# Patient Record
Sex: Female | Born: 2000 | Race: Black or African American | Hispanic: No | Marital: Single | Smoking: Never smoker
Health system: Southern US, Community
[De-identification: ages and names within clinical notes are randomized; demographics above are authoritative.]

## PROBLEM LIST (undated history)

## (undated) DIAGNOSIS — J45909 Unspecified asthma, uncomplicated: Secondary | ICD-10-CM

## (undated) DIAGNOSIS — T7840XA Allergy, unspecified, initial encounter: Secondary | ICD-10-CM

---

## 2018-03-23 ENCOUNTER — Encounter (HOSPITAL_COMMUNITY): Payer: Self-pay | Admitting: Emergency Medicine

## 2018-03-23 ENCOUNTER — Ambulatory Visit (HOSPITAL_COMMUNITY)
Admission: EM | Admit: 2018-03-23 | Discharge: 2018-03-23 | Disposition: A | Discharge status: Home / Self Care | Payer: Medicaid Other | Attending: Family Medicine | Admitting: Family Medicine

## 2018-03-23 ENCOUNTER — Other Ambulatory Visit: Payer: Self-pay

## 2018-03-23 DIAGNOSIS — R0789 Other chest pain: Secondary | ICD-10-CM | POA: Diagnosis not present

## 2018-03-23 HISTORY — DX: Unspecified asthma, uncomplicated: J45.909

## 2018-03-23 MED ORDER — GI COCKTAIL ~~LOC~~
ORAL | Status: AC
  Filled 2018-03-23: qty 30

## 2018-03-23 MED ORDER — GI COCKTAIL ~~LOC~~
30.0000 mL | Freq: Once | ORAL | Status: AC
  Administered 2018-03-23: 30 mL via ORAL

## 2018-03-23 NOTE — Discharge Instructions (Signed)
You have been seen at the [facility] today for chest pain. Your evaluation today was not suggestive of any emergent condition requiring medical intervention at this time. However, some medical problems make take more time to appear. Therefore, it is important for you to watch for any new symptoms or worsening of your current condition.  Please return the Urgent Care or to the Emergency Department immediately should you feel worse in any way or have any of the following symptoms: increasing or different chest pain, pain that spreads to your arm, neck, jaw, back or abdomen, shortness of breath, or nausea and vomiting.
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
## 2018-04-10 ENCOUNTER — Other Ambulatory Visit: Payer: Self-pay

## 2018-04-10 ENCOUNTER — Encounter (HOSPITAL_COMMUNITY): Payer: Self-pay | Admitting: Emergency Medicine

## 2018-04-10 ENCOUNTER — Emergency Department (HOSPITAL_COMMUNITY)
Admission: EM | Admit: 2018-04-10 | Discharge: 2018-04-11 | Disposition: A | Discharge status: Home / Self Care | Payer: Medicaid Other | Attending: Emergency Medicine | Admitting: Emergency Medicine

## 2018-04-10 DIAGNOSIS — Z79899 Other long term (current) drug therapy: Secondary | ICD-10-CM | POA: Insufficient documentation

## 2018-04-10 DIAGNOSIS — T7621XA Adult sexual abuse, suspected, initial encounter: Secondary | ICD-10-CM | POA: Insufficient documentation

## 2018-04-10 DIAGNOSIS — J45909 Unspecified asthma, uncomplicated: Secondary | ICD-10-CM | POA: Insufficient documentation

## 2018-04-10 DIAGNOSIS — T7421XA Adult sexual abuse, confirmed, initial encounter: Secondary | ICD-10-CM

## 2018-04-10 HISTORY — DX: Allergy, unspecified, initial encounter: T78.40XA

## 2018-04-10 LAB — PREGNANCY, URINE: Preg Test, Ur: NEGATIVE

## 2018-04-10 NOTE — ED Notes (Signed)
SANE RN in room speaking with pt.
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
## 2018-04-11 NOTE — SANE Note (Signed)
Follow-up Phone Call  Patient gives verbal consent for a FNE/SANE follow-up phone call in 48-72 hours: No Patient's telephone number: not given Patient gives verbal consent to leave voicemail at the phone number listed above: No DO NOT CALL between the hours of: No consent for follow up contact

## 2018-04-11 NOTE — SANE Note (Signed)
The patient states "I will go to the [facility] free STI testing. I just saw the flyer. If I don't go there I can go to my regular doctor, [facility]."

## 2018-04-12 ENCOUNTER — Ambulatory Visit (HOSPITAL_COMMUNITY)
Admission: EM | Admit: 2018-04-12 | Discharge: 2018-04-12 | Disposition: A | Discharge status: Home / Self Care | Payer: No Typology Code available for payment source | Source: Ambulatory Visit | Attending: Emergency Medicine | Admitting: Emergency Medicine

## 2018-04-12 ENCOUNTER — Encounter (HOSPITAL_COMMUNITY): Payer: Self-pay | Admitting: Emergency Medicine

## 2018-04-12 ENCOUNTER — Emergency Department (HOSPITAL_COMMUNITY)
Admission: EM | Admit: 2018-04-12 | Discharge: 2018-04-12 | Disposition: A | Discharge status: Home / Self Care | Payer: Medicaid Other | Attending: Emergency Medicine | Admitting: Emergency Medicine

## 2018-04-12 DIAGNOSIS — T7621XA Adult sexual abuse, suspected, initial encounter: Secondary | ICD-10-CM | POA: Diagnosis present

## 2018-04-12 DIAGNOSIS — Z79899 Other long term (current) drug therapy: Secondary | ICD-10-CM | POA: Diagnosis not present

## 2018-04-12 DIAGNOSIS — T7421XA Adult sexual abuse, confirmed, initial encounter: Secondary | ICD-10-CM

## 2018-04-12 DIAGNOSIS — J45909 Unspecified asthma, uncomplicated: Secondary | ICD-10-CM | POA: Insufficient documentation

## 2018-04-12 DIAGNOSIS — Z0441 Encounter for examination and observation following alleged adult rape: Secondary | ICD-10-CM | POA: Diagnosis present
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
## 2018-04-14 NOTE — ED Provider Notes (Signed)
[facility]ARE [facility]ENTER   [id number] 03/23/18 Arrival Time: 1652  ASSESSMENT & PLAN:  1. Discomfort in chest    Possibly related to GERD. Some improvement after GI cocktail. Question anxiety component. Discussed.  Meds ordered this encounter  Medications  . gi cocktail (Maalox,Lidocaine,Donnatal)   May try OTC Prilosec for a few weeks. Patient history and exam consistent with non-cardiac cause of chest pain. Conservative measures indicated.  Chest pain precautions given. ED if abrupt worsening or new symptoms. Reviewed expectations re: course of current medical issues. Questions answered. Outlined signs and symptoms indicating need for more acute intervention. Patient verbalized understanding. After Visit Summary given.   SUBJECTIVE:  Tiffany Adams is a 17 y.o. female who presents with complaint of:  Chest Discomfort: Mid to lower. Occasionally feels in back. Last felt today when walking to class. No SOB/n/v reported. Occasional panic attacks and questions if this is related to anxiety. Occasionally "feel my heart racing". Currently without chest discomfort. Describes discomfort as intermittent and sharp in nature. Rates discomfort as a 2/10 in intensity when present. No specific aggravating or alleviating factors reported. No new medications. Also reports a "dry cough" first noticed today. Asthma history but without wheezing currently. Cardiac risk factors: none. Patient's risk factors for DVT/PE: none. "Saw a heart doctor several years ago for same symptoms. Everything was normal". Questions if she is having heartburn sometimes. Occasional heartburn symptoms after eating.  Social History   Tobacco Use  Smoking Status Never Smoker  Smokeless Tobacco Never Used   OTC treatment: none.  ROS: As per HPI. All other systems negative.    OBJECTIVE:  Vitals:   03/23/18 1713  BP: 120/72  Pulse: 73  Temp: 98.3 F (36.8 C)  TempSrc: Oral  SpO2: 100%    General  appearance: alert; no distress Eyes: PERRLA; EOMI; conjunctiva normal HENT: normocephalic; atraumatic Neck: supple Lungs: clear to auscultation bilaterally Heart: regular rate and rhythm Chest Wall: none-tender Abdomen: soft, non-tender; bowel sounds normal; no masses or organomegaly; no guarding or rebound tenderness Extremities: no cyanosis or edema; symmetrical with no gross deformities Skin: warm and dry Psychological: alert and cooperative; normal mood and affect   No Known Allergies  Past Medical History:  Diagnosis Date  . Allergies   . Asthma    Social History   Socioeconomic History  . Marital status: Single    Spouse name: Not on file  . Number of children: Not on file  . Years of education: Not on file  . Highest education level: Not on file  Occupational History  . Not on file  Social Needs  . Financial resource strain: Not on file  . Food insecurity:    Worry: Not on file    Inability: Not on file  . Transportation needs:    Medical: Not on file    Non-medical: Not on file  Tobacco Use  . Smoking status: Never Smoker  . Smokeless tobacco: Never Used  Substance and Sexual Activity  . Alcohol use: Never    Frequency: Never  . Drug use: Never  . Sexual activity: Not on file  Lifestyle  . Physical activity:    Days per week: Not on file    Minutes per session: Not on file  . Stress: Not on file  Relationships  . Social connections:    Talks on phone: Not on file    Gets together: Not on file    Attends religious service: Not on file    Active member of  club or organization: Not on file    Attends meetings of clubs or organizations: Not on file    Relationship status: Not on file  . Intimate partner violence:    Fear of current or ex partner: Not on file    Emotionally abused: Not on file    Physically abused: Not on file    Forced sexual activity: Not on file  Other Topics Concern  . Not on file  Social History Narrative  . Not on file    FH: No cardiac disease reported.  No past surgical history on file.   Mardella Layman, MD 04/14/18 1400

## 2018-11-19 ENCOUNTER — Emergency Department (HOSPITAL_BASED_OUTPATIENT_CLINIC_OR_DEPARTMENT_OTHER)
Admission: EM | Admit: 2018-11-19 | Discharge: 2018-11-19 | Disposition: A | Discharge status: Home / Self Care | Payer: Medicaid Other | Attending: Emergency Medicine | Admitting: Emergency Medicine

## 2018-11-19 ENCOUNTER — Encounter (HOSPITAL_BASED_OUTPATIENT_CLINIC_OR_DEPARTMENT_OTHER): Payer: Self-pay | Admitting: Emergency Medicine

## 2018-11-19 ENCOUNTER — Other Ambulatory Visit: Payer: Self-pay

## 2018-11-19 DIAGNOSIS — N3944 Nocturnal enuresis: Secondary | ICD-10-CM | POA: Insufficient documentation

## 2018-11-19 DIAGNOSIS — J45909 Unspecified asthma, uncomplicated: Secondary | ICD-10-CM | POA: Insufficient documentation

## 2018-11-19 DIAGNOSIS — Z79899 Other long term (current) drug therapy: Secondary | ICD-10-CM | POA: Insufficient documentation

## 2018-11-19 LAB — BASIC METABOLIC PANEL
Anion gap: 7 (ref 5–15)
BUN: 10 mg/dL (ref 6–20)
CO2: 26 mmol/L (ref 22–32)
Calcium: 9.5 mg/dL (ref 8.9–10.3)
Chloride: 105 mmol/L (ref 98–111)
Creatinine, Ser: 0.55 mg/dL (ref 0.44–1.00)
GFR calc Af Amer: >60 mL/min (ref >60)
GFR calc non Af Amer: >60 mL/min (ref >60)
Glucose, Bld: 87 mg/dL (ref 70–99)
Potassium: 3.5 mmol/L (ref 3.5–5.1)
Sodium: 138 mmol/L (ref 135–145)

## 2018-11-19 LAB — URINALYSIS, ROUTINE W REFLEX MICROSCOPIC
Bilirubin Urine: NEGATIVE
Glucose, UA: NEGATIVE mg/dL
Hgb urine dipstick: NEGATIVE
Ketones, ur: NEGATIVE mg/dL
Leukocytes,Ua: NEGATIVE
Nitrite: NEGATIVE
Protein, ur: NEGATIVE mg/dL
Specific Gravity, Urine: 1.02 (ref 1.005–1.030)
pH: 7 (ref 5.0–8.0)

## 2018-11-19 LAB — CBC
HCT: 43.9 % (ref 36.0–46.0)
Hemoglobin: 14.6 g/dL (ref 12.0–15.0)
MCH: 30.7 pg (ref 26.0–34.0)
MCHC: 33.3 g/dL (ref 30.0–36.0)
MCV: 92.2 fL (ref 80.0–100.0)
Platelets: 340 10*3/uL (ref 150–400)
RBC: 4.76 MIL/uL (ref 3.87–5.11)
RDW: 12.3 % (ref 11.5–15.5)
WBC: 5.5 10*3/uL (ref 4.0–10.5)
nRBC: 0 % (ref 0.0–0.2)

## 2018-11-19 LAB — PREGNANCY, URINE: Preg Test, Ur: NEGATIVE

## 2018-11-19 NOTE — ED Notes (Signed)
ED Provider at bedside. 

## 2018-11-19 NOTE — Discharge Instructions (Signed)
Follow-up with urologist or gynecologist for further evaluation as we discussed.

## 2018-11-19 NOTE — ED Triage Notes (Signed)
Pt reports urinary frequency. Concerned because she has urinated in bed the past 2 nights. Denies dysuria.

## 2018-11-19 NOTE — ED Provider Notes (Signed)
[facility] EMERGENCY DEPARTMENT Provider Note   CSN: [id number] Arrival date & time: 11/19/18  1234    History   Chief Complaint Chief Complaint  Patient presents with  . Urinary Frequency    HPI Tiffany Adams is a 18 y.o. female.     HPI Patient presents the emergency room for evaluation of nocturnal enuresis.  Patient states she used to have frequent issues as a child up to the age of 18.  Patient states her mother never mentioned it to her medical doctor so this has never been evaluated.  Patient states that after the age of 18 she would have occasional episodes but not frequently.  Patient states she started having episodes again recently and had an episode both last night and the night before.  Patient states sometimes she will wake up and catch herself and is able to go the bathroom in time.  Last 2 episodes she woke up with the bed wet.  Patient denies any vaginal pain or irritation.  She is on birth control medications.  Patient denies any urinary frequency and has not had any issues with enuresis during the daytime.  She denies any vaginal discharge. Past Medical History:  Diagnosis Date  . Allergies   . Asthma     There are no active problems to display for this patient.   History reviewed. No pertinent surgical history.   OB History   No obstetric history on file.      Home Medications    Prior to Admission medications   Medication Sig Start Date End Date Taking? Authorizing Provider  albuterol (PROAIR HFA) 108 (90 Base) MCG/ACT inhaler Inhale 2 puffs into the lungs every 6 (six) hours as needed for wheezing or shortness of breath.    [provider]  cetirizine (ZYRTEC) 10 MG tablet Take 10 mg by mouth daily as needed for allergies.     [provider]  ibuprofen (ADVIL,MOTRIN) 200 MG tablet Take 400 mg by mouth every 6 (six) hours as needed for headache.    [provider]  medroxyPROGESTERone (DEPO-PROVERA) 150 MG/ML  injection Inject 150 mg into the muscle every 3 (three) months.    [provider]    Family History No family history on file.  Social History Social History   Tobacco Use  . Smoking status: Never Smoker  . Smokeless tobacco: Never Used  Substance Use Topics  . Alcohol use: Never    Frequency: Never  . Drug use: Never     Allergies   Patient has no known allergies.   Review of Systems Review of Systems  All other systems reviewed and are negative.    Physical Exam Updated Vital Signs BP 134/89 (BP Location: Right Arm)   Pulse 79   Temp 98.7 F (37.1 C) (Oral)   Resp 16   Ht 1.499 m (4\' 11" )   Wt 43.5 kg   SpO2 98%   BMI 19.39 kg/m   Physical Exam Vitals signs and nursing note reviewed.  Constitutional:      General: She is not in acute distress.    Appearance: She is well-developed.  HENT:     Head: Normocephalic and atraumatic.     Right Ear: External ear normal.     Left Ear: External ear normal.  Eyes:     General: No scleral icterus.       Right eye: No discharge.        Left eye: No discharge.  Conjunctiva/sclera: Conjunctivae normal.  Neck:     Musculoskeletal: Neck supple.     Trachea: No tracheal deviation.  Cardiovascular:     Rate and Rhythm: Normal rate and regular rhythm.  Pulmonary:     Effort: Pulmonary effort is normal. No respiratory distress.     Breath sounds: Normal breath sounds. No stridor. No wheezing or rales.  Abdominal:     General: Bowel sounds are normal. There is no distension.     Palpations: Abdomen is soft.     Tenderness: There is no abdominal tenderness. There is no guarding or rebound.  Musculoskeletal:        General: No tenderness.  Skin:    General: Skin is warm and dry.     Findings: No rash.  Neurological:     Mental Status: She is alert.     Cranial Nerves: No cranial nerve deficit (no facial droop, extraocular movements intact, no slurred speech).     Sensory: No sensory deficit.      Motor: No abnormal muscle tone or seizure activity.     Coordination: Coordination normal.      ED Treatments / Results  Labs (all labs ordered are listed, but only abnormal results are displayed) Labs Reviewed  URINALYSIS, ROUTINE W REFLEX MICROSCOPIC  PREGNANCY, URINE  CBC  BASIC METABOLIC PANEL    EKG None  Radiology No results found.  Procedures Procedures (including critical care time)  Medications Ordered in ED Medications - No data to display   Initial Impression / Assessment and Plan / ED Course  I have reviewed the triage vital signs and the nursing notes.  Pertinent labs & imaging results that were available during my care of the patient were reviewed by me and considered in my medical decision making (see chart for details).  Patient's labs are unremarkable.  No findings to suggest infection.  Patient has had this issue since childhood but has developed again more recently.  Recommend she follow-up with a urologist or gynecologist for further evaluation and treatment.  Final Clinical Impressions(s) / ED Diagnoses   Final diagnoses:  Nocturnal enuresis    ED Discharge Orders    None       Dorie Rank, MD 11/19/18 1444

## 2019-02-02 ENCOUNTER — Other Ambulatory Visit: Payer: Self-pay

## 2019-02-02 ENCOUNTER — Emergency Department (HOSPITAL_BASED_OUTPATIENT_CLINIC_OR_DEPARTMENT_OTHER): Payer: Self-pay

## 2019-02-02 ENCOUNTER — Emergency Department (HOSPITAL_BASED_OUTPATIENT_CLINIC_OR_DEPARTMENT_OTHER)
Admission: EM | Admit: 2019-02-02 | Discharge: 2019-02-02 | Disposition: A | Discharge status: Home / Self Care | Payer: Self-pay | Attending: Emergency Medicine | Admitting: Emergency Medicine

## 2019-02-02 ENCOUNTER — Encounter (HOSPITAL_BASED_OUTPATIENT_CLINIC_OR_DEPARTMENT_OTHER): Payer: Self-pay | Admitting: *Deleted

## 2019-02-02 DIAGNOSIS — R079 Chest pain, unspecified: Secondary | ICD-10-CM

## 2019-02-02 DIAGNOSIS — R0789 Other chest pain: Secondary | ICD-10-CM | POA: Insufficient documentation

## 2019-02-02 DIAGNOSIS — J45909 Unspecified asthma, uncomplicated: Secondary | ICD-10-CM | POA: Insufficient documentation

## 2019-02-02 NOTE — ED Triage Notes (Addendum)
Pain in the center of her chest with radiation under her right breast time. On and off since 2017. States she has been checked many times with no diagnosis. States she has been having panic attacks recently.

## 2019-02-02 NOTE — Discharge Instructions (Addendum)
You were evaluated in the Emergency Department and after careful evaluation, we did not find any emergent condition requiring admission or further testing in the hospital.  Your exam and testing today was reassuring.  As discussed, please follow-up with a primary care doctor or cardiologist for further management of her symptoms.  Please return to the Emergency Department if you experience any worsening of your condition.  We encourage you to follow up with a primary care provider.  Thank you for allowing [place] to be a part of your care.

## 2019-02-02 NOTE — ED Provider Notes (Signed)
[facility] Provider Note MRN:  [id number]  Arrival date & time: 02/02/19     Chief Complaint   Chest Pain   History of Present Illness   Tiffany Adams is a 18 y.o. year-old female with a history of asthma presenting to the ED with chief complaint of chest pain.  Intermittent chest pain for the past 3 years, has been followed up by primary care doctor, cardiologist, placed on cardiac monitoring, revealing intermittent arrhythmia.  Patient having increased frequency of chest discomfort for the past several days.  Described as sharp and sometimes dull, intermittent, episodes only lasting 30 minutes.  Happened this morning while she was in class.  Explains that the symptoms cause her significant anxiety as well.  She denies dizziness or diaphoresis, no headache or vision change, no nausea or vomiting, no shortness of breath, no leg pain or swelling, denies birth control pills, no history of blood clots.  Review of Systems  A complete 10 system review of systems was obtained and all systems are negative except as noted in the HPI and PMH.   Patient's Health History    Past Medical History:  Diagnosis Date  . Allergies   . Asthma     History reviewed. No pertinent surgical history.  No family history on file.  Social History   Socioeconomic History  . Marital status: Single    Spouse name: Not on file  . Number of children: Not on file  . Years of education: Not on file  . Highest education level: Not on file  Occupational History  . Not on file  Social Needs  . Financial resource strain: Not on file  . Food insecurity    Worry: Not on file    Inability: Not on file  . Transportation needs    Medical: Not on file    Non-medical: Not on file  Tobacco Use  . Smoking status: Never Smoker  . Smokeless tobacco: Never Used  Substance and Sexual Activity  . Alcohol use: Never    Frequency: Never  . Drug use: Never  . Sexual  activity: Not on file  Lifestyle  . Physical activity    Days per week: Not on file    Minutes per session: Not on file  . Stress: Not on file  Relationships  . Social Musicianconnections    Talks on phone: Not on file    Gets together: Not on file    Attends religious service: Not on file    Active member of club or organization: Not on file    Attends meetings of clubs or organizations: Not on file    Relationship status: Not on file  . Intimate partner violence    Fear of current or ex partner: Not on file    Emotionally abused: Not on file    Physically abused: Not on file    Forced sexual activity: Not on file  Other Topics Concern  . Not on file  Social History Narrative  . Not on file     Physical Exam  Vital Signs and Nursing Notes reviewed Vitals:   02/02/19 1249  BP: 120/70  Pulse: 78  Resp: 20  Temp: 99 F (37.2 C)  SpO2: 100%    CONSTITUTIONAL: Well-appearing, NAD NEURO:  Alert and oriented x 3, no focal deficits EYES:  eyes equal and reactive ENT/NECK:  no LAD, no JVD CARDIO: Regular rate, well-perfused, normal S1 and S2 PULM:  CTAB no wheezing  or rhonchi GI/GU:  normal bowel sounds, non-distended, non-tender MSK/SPINE:  No gross deformities, no edema SKIN:  no rash, atraumatic PSYCH:  Appropriate speech and behavior  Diagnostic and Interventional Summary    EKG Interpretation  Date/Time:  Thursday February 02 2019 13:16:47 EDT Ventricular Rate:  83 PR Interval:  130 QRS Duration: 72 QT Interval:  364 QTC Calculation: 427 R Axis:   52 Text Interpretation:  Normal sinus rhythm with sinus arrhythmia Normal ECG Confirmed by Gerlene Fee [id number]) on 02/02/2019 1:49:35 PM      Labs Reviewed - No data to display  DG Chest 2 View  Final Result      Medications - No data to display   Procedures Critical Care  ED Course and Medical Decision Making  I have reviewed the triage vital signs and the nursing notes.  Pertinent labs & imaging results that  were available during my care of the patient were reviewed by me and considered in my medical decision making (see below for details).  Intermittent chest pain for 3 years, increased frequency recently.  Suspect symptomatic from PVCs, has had cardiac monitoring in the past.  PERC negative, EKG without acute concerns, chest x-ray reassuring.  Symptoms only lasting a maximum of 30 minutes at a time.  No emergent process is evident today, appropriate for PCP follow-up.  Barth Kirks. Sedonia Small, [place] [email or web address]  Final Clinical Impressions(s) / ED Diagnoses     ICD-10-CM   1. Chest pain  R07.9 DG Chest 2 View    DG Chest 2 View    ED Discharge Orders    None        Discharge Instructions     You were evaluated in the Emergency Department and after careful evaluation, we did not find any emergent condition requiring admission or further testing in the hospital.  Your exam and testing today was reassuring.  As discussed, please follow-up with a primary care doctor or cardiologist for further management of her symptoms.  Please return to the Emergency Department if you experience any worsening of your condition.  We encourage you to follow up with a primary care provider.  Thank you for allowing [place] to be a part of your care.        Maudie Flakes, MD 02/02/19 [phone]

## 2021-01-01 IMAGING — CR CHEST - 2 VIEW
2 series · 2 of 2 positions shown · non-contrast
Comparison: None.

CLINICAL DATA: Chest pain for 1 day

EXAM:
CHEST - 2 VIEW

[w chest pa]
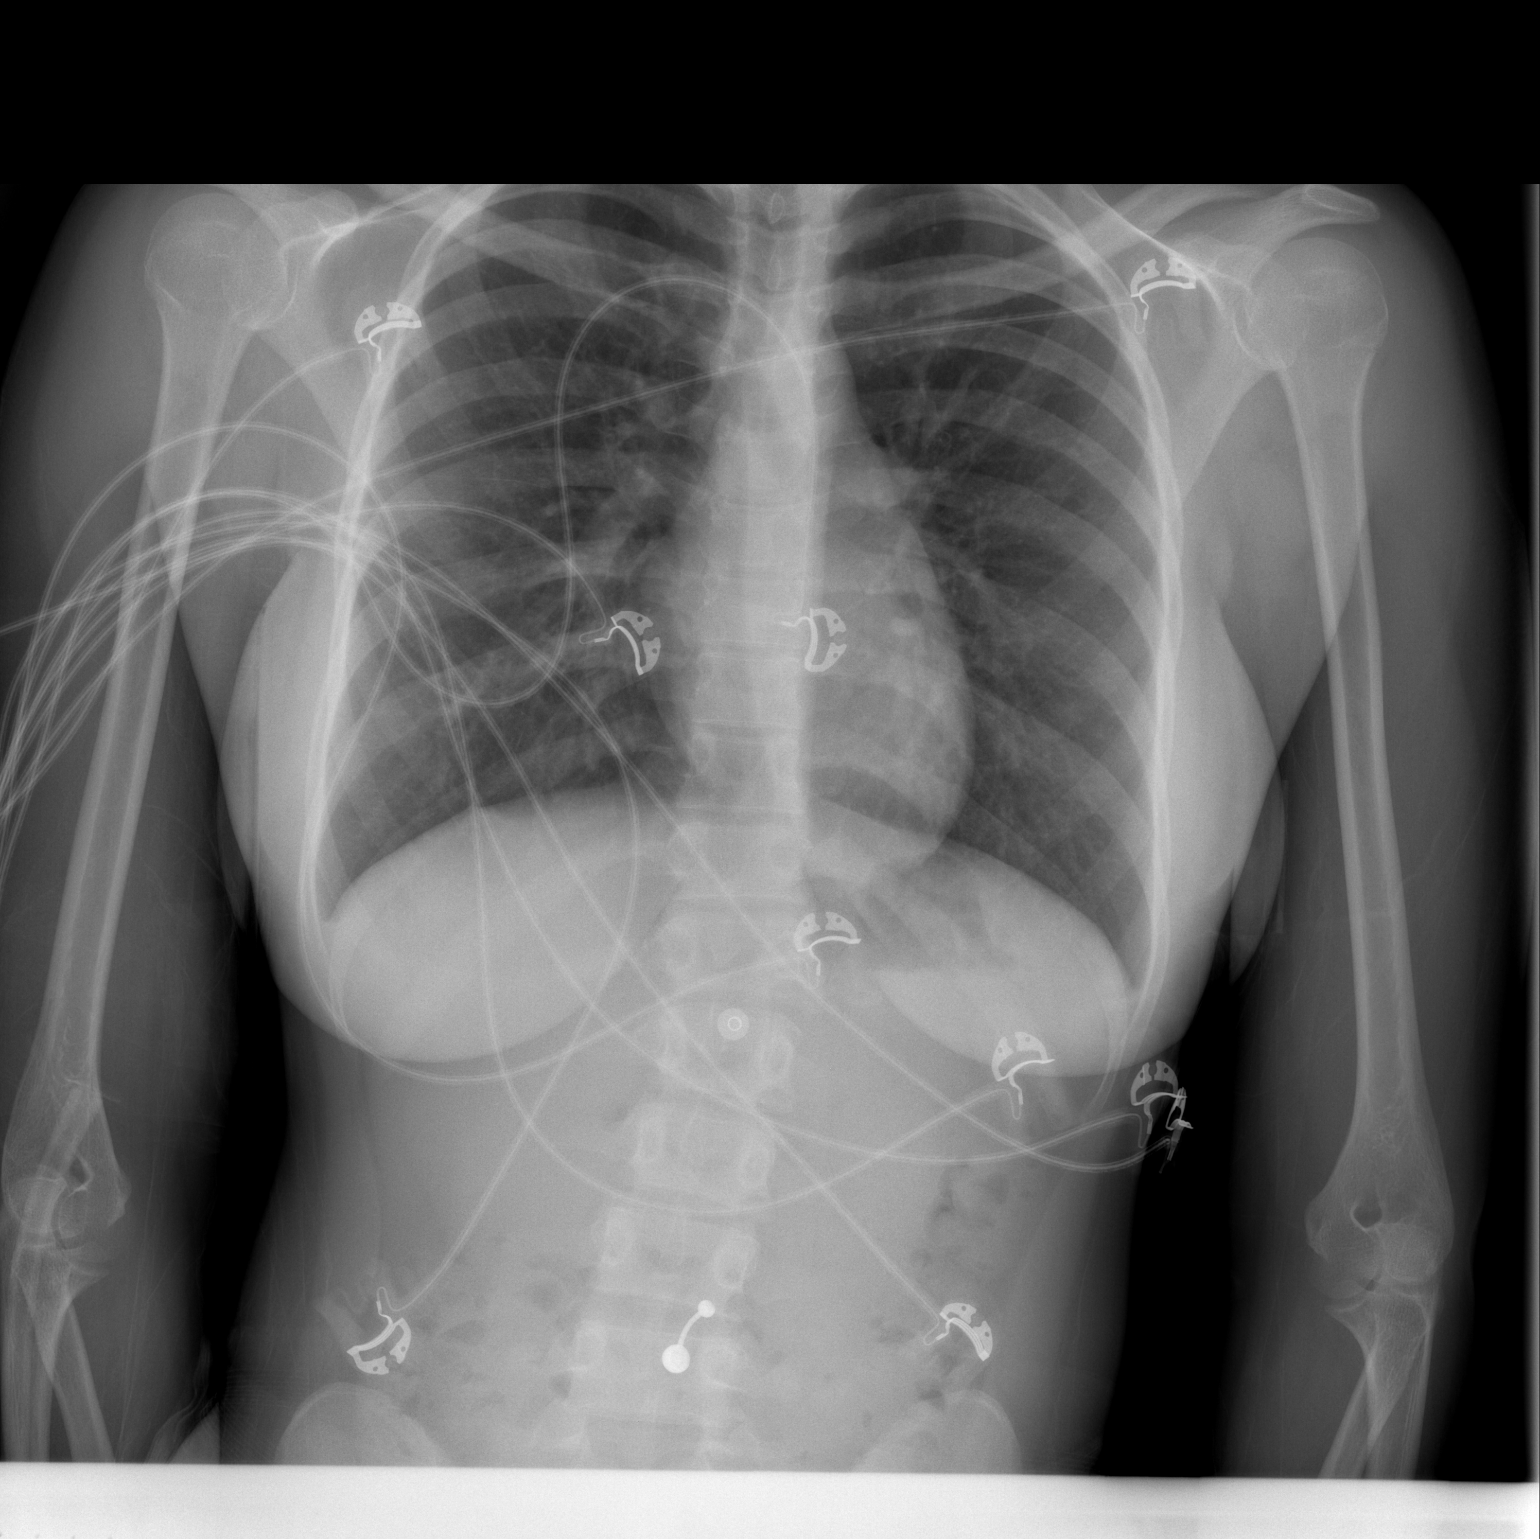

[w chest lat]
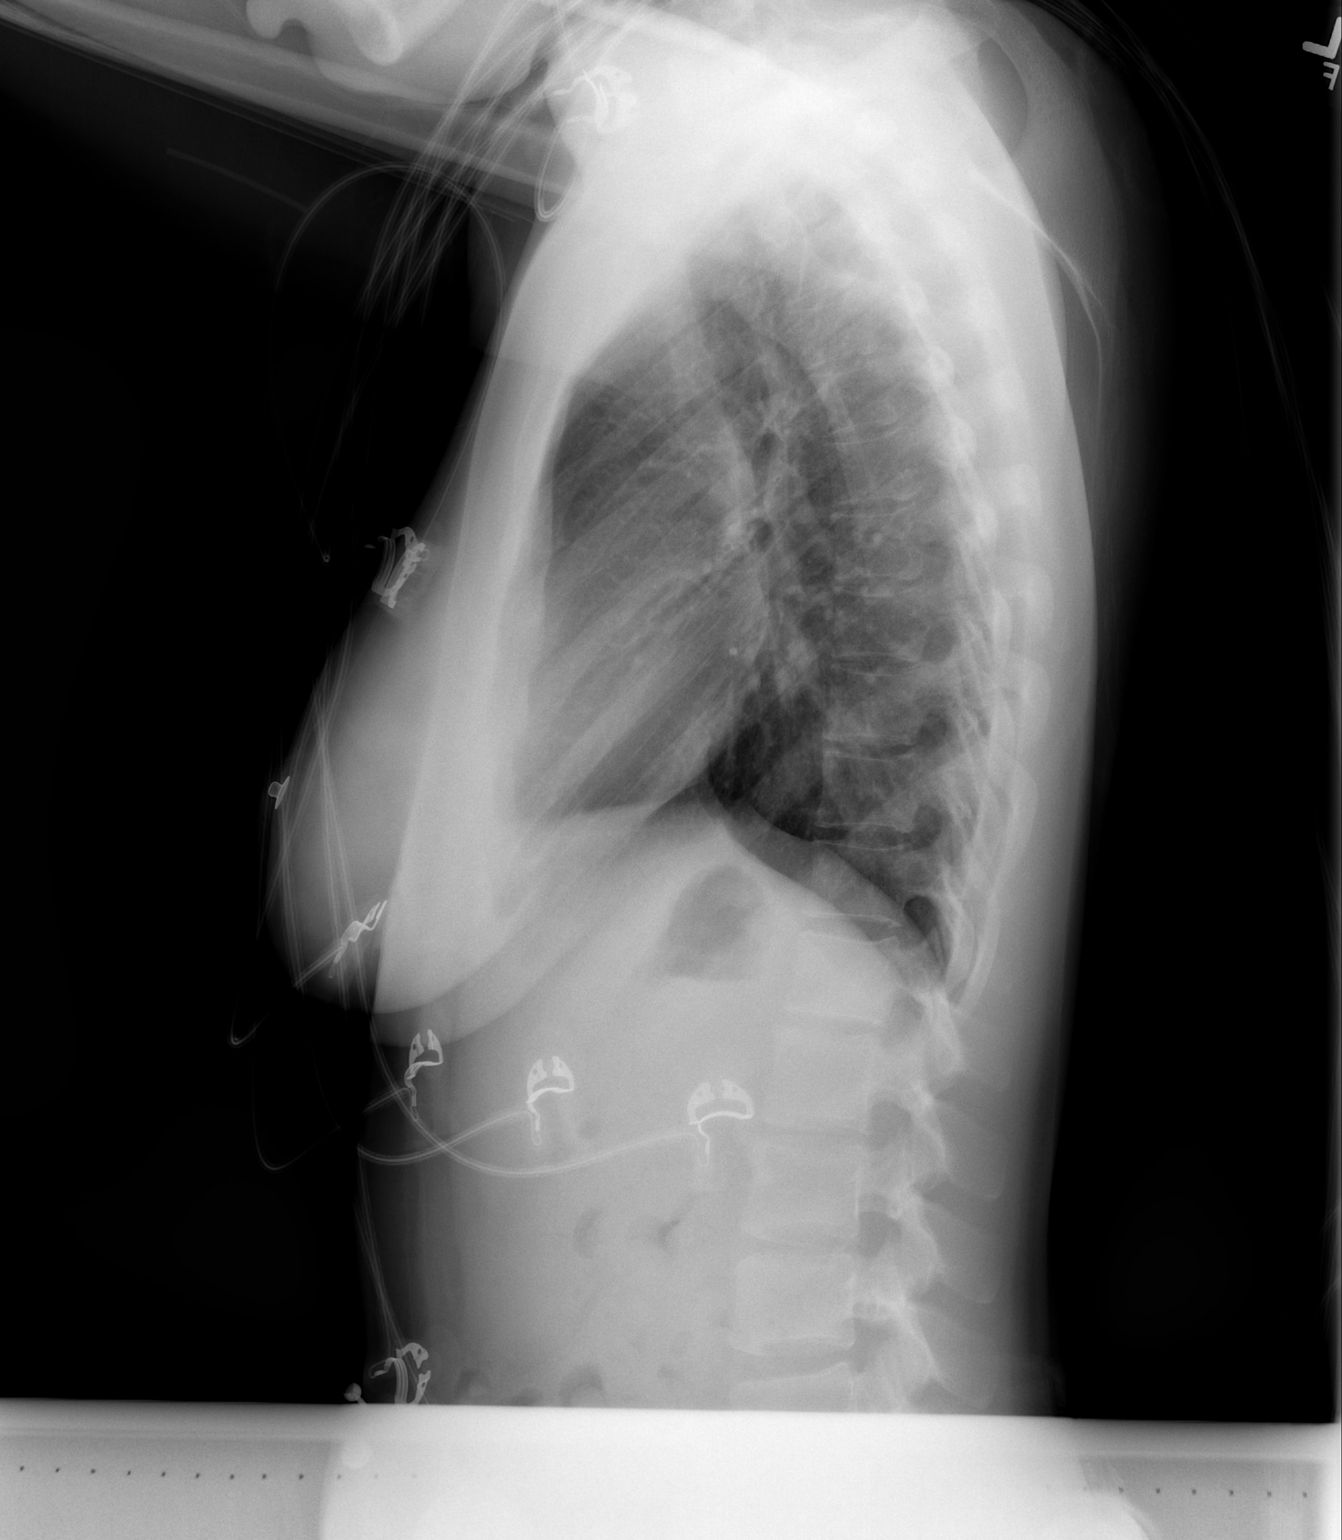

[2 of 2 positions shown; findings below may reference images not displayed]

FINDINGS: The heart size and mediastinal contours are within normal limits.
Both lungs are clear. The visualized skeletal structures are
unremarkable.
IMPRESSION: No active cardiopulmonary disease.
